# Patient Record
Sex: Male | Born: 2000
Health system: Southern US, Community
[De-identification: ages and names within clinical notes are randomized; demographics above are authoritative.]

## PROBLEM LIST (undated history)

## (undated) DIAGNOSIS — J45909 Unspecified asthma, uncomplicated: Principal | ICD-10-CM

## (undated) DIAGNOSIS — T7840XA Allergy, unspecified, initial encounter: Secondary | ICD-10-CM

## (undated) HISTORY — DX: Unspecified asthma, uncomplicated: J45.909

## (undated) HISTORY — PX: TEAR DUCT PROBING: SHX793

## (undated) HISTORY — DX: Allergy, unspecified, initial encounter: T78.40XA

---
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
## 2022-01-31 DIAGNOSIS — J301 Allergic rhinitis due to pollen: Secondary | ICD-10-CM | POA: Diagnosis not present

## 2022-01-31 DIAGNOSIS — R059 Cough, unspecified: Secondary | ICD-10-CM | POA: Diagnosis not present

## 2022-01-31 DIAGNOSIS — J029 Acute pharyngitis, unspecified: Secondary | ICD-10-CM | POA: Diagnosis not present

## 2022-06-06 ENCOUNTER — Encounter: Payer: Self-pay | Admitting: Pediatrics

## 2022-08-18 ENCOUNTER — Other Ambulatory Visit: Payer: Self-pay | Admitting: Pediatrics

## 2022-08-18 MED ORDER — MONTELUKAST SODIUM 10 MG PO TABS: 10.0000 mg | ORAL_TABLET | Freq: Every day | ORAL | 0 refills | Status: DC

## 2022-08-18 MED ORDER — AZITHROMYCIN 250 MG PO TABS: ORAL_TABLET | ORAL | 0 refills | Status: AC

## 2022-08-18 MED ORDER — ALBUTEROL SULFATE HFA 108 (90 BASE) MCG/ACT IN AERS: 2.0000 | INHALATION_SPRAY | Freq: Four times a day (QID) | RESPIRATORY_TRACT | 11 refills | Status: AC | PRN

## 2022-09-14 ENCOUNTER — Ambulatory Visit (INDEPENDENT_AMBULATORY_CARE_PROVIDER_SITE_OTHER): Payer: BC Managed Care – PPO | Admitting: Pediatrics

## 2022-09-14 ENCOUNTER — Encounter: Payer: Self-pay | Admitting: Pediatrics

## 2022-09-14 VITALS — Wt 160.6 lb

## 2022-09-14 DIAGNOSIS — J019 Acute sinusitis, unspecified: Secondary | ICD-10-CM

## 2022-09-14 DIAGNOSIS — B9689 Other specified bacterial agents as the cause of diseases classified elsewhere: Secondary | ICD-10-CM | POA: Diagnosis not present

## 2022-09-14 MED ORDER — AMOXICILLIN-POT CLAVULANATE 500-125 MG PO TABS: 1.0000 | ORAL_TABLET | Freq: Two times a day (BID) | ORAL | 0 refills | Status: AC

## 2022-09-14 NOTE — Progress Notes (Signed)
Subjective:     Steven Fuller is a 21 y.o. male who presents for post-nasal drainage. He has had a productive cough on and off for the past year. Starting over the summer, he will gag on mucus. He reports that the cough has resolved but he continues to gag on mucus and complains of sinus pain.   The following portions of the patient's history were reviewed and updated as appropriate: allergies, current medications, past family history, past medical history, past social history, past surgical history, and problem list.  Review of Systems Pertinent items are noted in HPI.   Objective:    Wt 160 lb 9.6 oz (72.8 kg)   BMI 22.40 kg/m  General appearance: alert, cooperative, appears stated age, and no distress Head: Normocephalic, without obvious abnormality, atraumatic Eyes: conjunctivae/corneas clear. PERRL, EOM's intact. Fundi benign. Ears: normal TM's and external ear canals both ears Nose: moderate congestion, turbinates swollen, sinus tenderness bilateral Throat: lips, mucosa, and tongue normal; teeth and gums normal Neck: no adenopathy, no carotid bruit, no JVD, supple, symmetrical, trachea midline, and thyroid not enlarged, symmetric, no tenderness/mass/nodules Lungs: clear to auscultation bilaterally Heart: regular rate and rhythm, S1, S2 normal, no murmur, click, rub or gallop    Assessment:    Acute bacterial sinusitis.    Plan:    Nasal saline sprays. Neti pot recommended. Instructions given. Augmentin per medication orders. Follow up in a few days or as needed.

## 2022-09-14 NOTE — Patient Instructions (Addendum)
Zyrtec daily in the morning Continue Singulair at bedtime Augmentin 2 times a day for 10 days Take daily probiotic while on antibiotics Flonase- 2 spray in each nostril once a day in the morning  Drink plenty of water Follow up as needed  At [facility] we value your feedback. You may receive a survey about your visit today. Please share your experience as we strive to create trusting relationships with our patients to provide genuine, compassionate, quality care.

## 2022-09-15 ENCOUNTER — Encounter: Payer: Self-pay | Admitting: Pediatrics

## 2022-10-25 ENCOUNTER — Ambulatory Visit (INDEPENDENT_AMBULATORY_CARE_PROVIDER_SITE_OTHER): Payer: BC Managed Care – PPO | Admitting: Pediatrics

## 2022-10-25 ENCOUNTER — Ambulatory Visit
Admission: RE | Admit: 2022-10-25 | Discharge: 2022-10-25 | Disposition: A | Discharge status: Home / Self Care | Payer: BC Managed Care – PPO | Source: Ambulatory Visit | Attending: Pediatrics | Admitting: Pediatrics

## 2022-10-25 ENCOUNTER — Encounter: Payer: Self-pay | Admitting: Pediatrics

## 2022-10-25 VITALS — BP 112/84 | Ht 71.0 in | Wt 160.2 lb

## 2022-10-25 DIAGNOSIS — Z0001 Encounter for general adult medical examination with abnormal findings: Secondary | ICD-10-CM

## 2022-10-25 DIAGNOSIS — Z00129 Encounter for routine child health examination without abnormal findings: Secondary | ICD-10-CM

## 2022-10-25 DIAGNOSIS — R0981 Nasal congestion: Secondary | ICD-10-CM | POA: Diagnosis not present

## 2022-10-25 DIAGNOSIS — R053 Chronic cough: Secondary | ICD-10-CM

## 2022-10-25 DIAGNOSIS — Z68.41 Body mass index (BMI) pediatric, 5th percentile to less than 85th percentile for age: Secondary | ICD-10-CM

## 2022-10-25 DIAGNOSIS — R059 Cough, unspecified: Secondary | ICD-10-CM | POA: Diagnosis not present

## 2022-10-25 DIAGNOSIS — Z1339 Encounter for screening examination for other mental health and behavioral disorders: Secondary | ICD-10-CM | POA: Diagnosis not present

## 2022-10-25 MED ORDER — MONTELUKAST SODIUM 10 MG PO TABS: 10.0000 mg | ORAL_TABLET | Freq: Every day | ORAL | 12 refills | Status: AC

## 2022-10-25 MED ORDER — FLUTICASONE PROPIONATE 50 MCG/ACT NA SUSP: 1.0000 | Freq: Every day | NASAL | 12 refills | Status: AC

## 2022-10-25 NOTE — Patient Instructions (Signed)
Preventive Care 18-21 Years Old, Male ?Preventive care refers to lifestyle choices and visits with your health care provider that can promote health and wellness. At this stage in your life, you may start seeing a primary care physician instead of a pediatrician for your preventive care. Preventive care visits are also called wellness exams. ?What can I expect for my preventive care visit? ?Counseling ?During your preventive care visit, your health care provider may ask about your: ?Medical history, including: ?Past medical problems. ?Family medical history. ?Current health, including: ?Home life and relationship well-being. ?Emotional well-being. ?Sexual activity and sexual health. ?Lifestyle, including: ?Alcohol, nicotine or tobacco, and drug use. ?Access to firearms. ?Diet, exercise, and sleep habits. ?Sunscreen use. ?Motor vehicle safety. ?Physical exam ?Your health care provider may check your: ?Height and weight. These may be used to calculate your BMI (body mass index). BMI is a measurement that tells if you are at a healthy weight. ?Waist circumference. This measures the distance around your waistline. This measurement also tells if you are at a healthy weight and may help predict your risk of certain diseases, such as type 2 diabetes and high blood pressure. ?Heart rate and blood pressure. ?Body temperature. ?Skin for abnormal spots. ?What immunizations do I need? ? ?Vaccines are usually given at various ages, according to a schedule. Your health care provider will recommend vaccines for you based on your age, medical history, and lifestyle or other factors, such as travel or where you work. ?What tests do I need? ?Screening ?Your health care provider may recommend screening tests for certain conditions. This may include: ?Vision and hearing tests. ?Lipid and cholesterol levels. ?Hepatitis B test. ?Hepatitis C test. ?HIV (human immunodeficiency virus) test. ?STI (sexually transmitted infection) testing, if  you are at risk. ?Tuberculosis skin test. ?Talk with your health care provider about your test results, treatment options, and if necessary, the need for more tests. ?Follow these instructions at home: ?Eating and drinking ? ?Eat a healthy diet that includes fresh fruits and vegetables, whole grains, lean protein, and low-fat dairy products. ?Drink enough fluid to keep your urine pale yellow. ?Do not drink alcohol if: ?Your health care provider tells you not to drink. ?You are under the legal drinking age. In the [place] the legal drinking age is 21. ?If you drink alcohol: ?Limit how much you have to 0-2 drinks a day. ?Know how much alcohol is in your drink. In the [place] one drink equals one 12 oz bottle of beer (355 mL), one 5 oz glass of wine (148 mL), or one 1? oz glass of hard liquor (44 mL). ?Lifestyle ?Brush your teeth every morning and night with fluoride toothpaste. Floss one time each day. ?Exercise for at least 30 minutes 5 or more days of the week. ?Do not use any products that contain nicotine or tobacco. These products include cigarettes, chewing tobacco, and vaping devices, such as e-cigarettes. If you need help quitting, ask your health care provider. ?Do not use drugs. ?If you are sexually active, practice safe sex. Use a condom or other form of protection to prevent STIs. ?Find healthy ways to manage stress, such as: ?Meditation, yoga, or listening to music. ?Journaling. ?Talking to a trusted person. ?Spending time with friends and family. ?Safety ?Always wear your seat belt while driving or riding in a vehicle. ?Do not drive: ?If you have been drinking alcohol. Do not ride with someone who has been drinking. ?When you are tired or distracted. ?While texting. ?If you have been using   any mind-altering substances or drugs. ?Wear a helmet and other protective equipment during sports activities. ?If you have firearms in your house, make sure you follow all gun safety procedures. ?Seek help if you have  been bullied, physically abused, or sexually abused. ?Use the internet responsibly to avoid dangers, such as online bullying and online sex predators. ?What's next? ?Go to your health care provider once a year for an annual wellness visit. ?Ask your health care provider how often you should have your eyes and teeth checked. ?Stay up to date on all vaccines. ?This information is not intended to replace advice given to you by your health care provider. Make sure you discuss any questions you have with your health care provider. ?Document Revised: 04/07/2021 Document Reviewed: 04/07/2021 ?Elsevier Patient Education ? 2023 [employer] ? ?

## 2022-10-25 NOTE — Progress Notes (Unsigned)
Swab matched for 4 year ALL---donor   09/12/24 --flu and COVID

## 2022-10-26 ENCOUNTER — Encounter: Payer: Self-pay | Admitting: Pediatrics

## 2022-10-26 DIAGNOSIS — R053 Chronic cough: Secondary | ICD-10-CM | POA: Insufficient documentation

## 2023-05-27 ENCOUNTER — Emergency Department (HOSPITAL_BASED_OUTPATIENT_CLINIC_OR_DEPARTMENT_OTHER)
Admission: EM | Admit: 2023-05-27 | Discharge: 2023-05-27 | Disposition: A | Discharge status: Home / Self Care | Payer: BC Managed Care – PPO | Attending: Emergency Medicine | Admitting: Emergency Medicine

## 2023-05-27 ENCOUNTER — Other Ambulatory Visit: Payer: Self-pay

## 2023-05-27 ENCOUNTER — Encounter (HOSPITAL_BASED_OUTPATIENT_CLINIC_OR_DEPARTMENT_OTHER): Payer: Self-pay

## 2023-05-27 DIAGNOSIS — Z20822 Contact with and (suspected) exposure to covid-19: Secondary | ICD-10-CM | POA: Diagnosis not present

## 2023-05-27 DIAGNOSIS — B349 Viral infection, unspecified: Secondary | ICD-10-CM

## 2023-05-27 DIAGNOSIS — R197 Diarrhea, unspecified: Secondary | ICD-10-CM | POA: Insufficient documentation

## 2023-05-27 DIAGNOSIS — R519 Headache, unspecified: Secondary | ICD-10-CM | POA: Diagnosis not present

## 2023-05-27 DIAGNOSIS — R1110 Vomiting, unspecified: Secondary | ICD-10-CM | POA: Insufficient documentation

## 2023-05-27 DIAGNOSIS — R7309 Other abnormal glucose: Secondary | ICD-10-CM | POA: Insufficient documentation

## 2023-05-27 LAB — CBC WITH DIFFERENTIAL/PLATELET
Abs Immature Granulocytes: 0.02 10*3/uL (ref 0.00–0.07)
Basophils Absolute: 0 10*3/uL (ref 0.0–0.1)
Basophils Relative: 0 %
Eosinophils Absolute: 0 10*3/uL (ref 0.0–0.5)
Eosinophils Relative: 0 %
HCT: 44.2 % (ref 39.0–52.0)
Hemoglobin: 16.2 g/dL (ref 13.0–17.0)
Immature Granulocytes: 0 %
Lymphocytes Relative: 6 %
Lymphs Abs: 0.6 10*3/uL — ABNORMAL LOW (ref 0.7–4.0)
MCH: 31.6 pg (ref 26.0–34.0)
MCHC: 36.7 g/dL — ABNORMAL HIGH (ref 30.0–36.0)
MCV: 86.3 fL (ref 80.0–100.0)
Monocytes Absolute: 0.7 10*3/uL (ref 0.1–1.0)
Monocytes Relative: 6 %
Neutro Abs: 9.2 10*3/uL — ABNORMAL HIGH (ref 1.7–7.7)
Neutrophils Relative %: 88 %
Platelets: 178 10*3/uL (ref 150–400)
RBC: 5.12 MIL/uL (ref 4.22–5.81)
RDW: 12.2 % (ref 11.5–15.5)
WBC: 10.5 10*3/uL (ref 4.0–10.5)
nRBC: 0 % (ref 0.0–0.2)

## 2023-05-27 LAB — COMPREHENSIVE METABOLIC PANEL
ALT: 18 U/L (ref 0–44)
AST: 18 U/L (ref 15–41)
Albumin: 4.3 g/dL (ref 3.5–5.0)
Alkaline Phosphatase: 68 U/L (ref 38–126)
Anion gap: 9 (ref 5–15)
BUN: 11 mg/dL (ref 6–20)
CO2: 25 mmol/L (ref 22–32)
Calcium: 9.3 mg/dL (ref 8.9–10.3)
Chloride: 102 mmol/L (ref 98–111)
Creatinine, Ser: 1.03 mg/dL (ref 0.61–1.24)
GFR, Estimated: >60 mL/min (ref >60)
Glucose, Bld: 131 mg/dL — ABNORMAL HIGH (ref 70–99)
Potassium: 3.3 mmol/L — ABNORMAL LOW (ref 3.5–5.1)
Sodium: 136 mmol/L (ref 135–145)
Total Bilirubin: 1 mg/dL (ref 0.3–1.2)
Total Protein: 7.3 g/dL (ref 6.5–8.1)

## 2023-05-27 LAB — SARS CORONAVIRUS 2 BY RT PCR: SARS Coronavirus 2 by RT PCR: NEGATIVE

## 2023-05-27 LAB — LIPASE, BLOOD: Lipase: 29 U/L (ref 11–51)

## 2023-05-27 MED ORDER — SODIUM CHLORIDE 0.9 % IV BOLUS
1000.0000 mL | Freq: Once | INTRAVENOUS | Status: AC
  Administered 2023-05-27: 1000 mL via INTRAVENOUS

## 2023-05-27 MED ORDER — DEXAMETHASONE SODIUM PHOSPHATE 10 MG/ML IJ SOLN
8.0000 mg | Freq: Once | INTRAMUSCULAR | Status: AC
  Administered 2023-05-27: 8 mg via INTRAVENOUS
  Filled 2023-05-27: qty 1

## 2023-05-27 MED ORDER — KETOROLAC TROMETHAMINE 30 MG/ML IJ SOLN
30.0000 mg | Freq: Once | INTRAMUSCULAR | Status: AC
  Administered 2023-05-27: 30 mg via INTRAVENOUS
  Filled 2023-05-27: qty 1

## 2023-05-27 MED ORDER — ONDANSETRON 4 MG PO TBDP: 4.0000 mg | ORAL_TABLET | Freq: Three times a day (TID) | ORAL | 0 refills | Status: AC | PRN

## 2023-05-27 NOTE — ED Notes (Signed)
Pt d/c home with father per MD order . Discharge summary reviewed, pt verbalizes understanding. Ambulatory off unit. No s/s of acute distress noted at discharge.

## 2023-05-27 NOTE — ED Provider Notes (Incomplete)
[place] EMERGENCY DEPARTMENT AT [facility] Provider Note   CSN: [id number] Arrival date & time: 05/27/23  1701     History {Add pertinent medical, surgical, social history, OB history to HPI:1} Chief Complaint  Patient presents with   Emesis   Headache    Steven Fuller is a 22 y.o. male who presents emergency department for flulike symptoms.  Patient just returned from a trip from [place] a few days ago.  He did go to [place] for a day.  2 days he developed a headache.  He has had a constant headache since that time which is throbbing.  He has also had 2 episodes of vomiting and a few episodes of diarrhea.  He has been resting over the past 2 days.  He denies any photophobia, phonophobia, fever, rash.  He took Tylenol, antidiarrheals.  He had some relief of his take and complete resolution of his diarrhea.  .  He has not had any nausea.he has been vaccinated for meningitis.  He is unsure of any contacts with similar symptoms.  He called a nurse triage line today and due to his neck being sore he was sent in for further evaluation.   Emesis Associated symptoms: headaches   Headache Associated symptoms: vomiting        Home Medications Prior to Admission medications   Medication Sig Start Date End Date Taking? Authorizing Provider  albuterol (VENTOLIN HFA) 108 (90 Base) MCG/ACT inhaler Inhale 2 puffs into the lungs every 6 (six) hours as needed for wheezing or shortness of breath. 08/18/22   Georgiann Hahn, MD  fluticasone (FLONASE) 50 MCG/ACT nasal spray Place 1 spray into both nostrils daily. 10/25/22 11/24/22  Georgiann Hahn, MD  montelukast (SINGULAIR) 10 MG tablet Take 1 tablet (10 mg total) by mouth at bedtime. 10/25/22 11/24/22  Georgiann Hahn, MD      Allergies    Patient has no known allergies.    Review of Systems   Review of Systems  Gastrointestinal:  Positive for vomiting.  Neurological:  Positive for headaches.    Physical Exam Updated Vital  Signs BP (!) 140/72 (BP Location: Right Arm)   Pulse (!) 104   Temp 99.5 F (37.5 C) (Oral)   Resp 19   Ht 5\' 11"  (1.803 m)   Wt 74.8 kg   SpO2 99%   BMI 23.01 kg/m  Physical Exam Vitals and nursing note reviewed.  Constitutional:      General: He is not in acute distress.    Appearance: He is well-developed. He is not diaphoretic.  HENT:     Head: Normocephalic and atraumatic.     Nose: Nose normal.     Mouth/Throat:     Mouth: Mucous membranes are moist.  Eyes:     General: No scleral icterus.    Conjunctiva/sclera: Conjunctivae normal.     Pupils: Pupils are equal, round, and reactive to light.     Comments: No horizontal, vertical or rotational nystagmus  Neck:     Comments: Full active and passive ROM  Soreness with flexion of the neck No midline or paraspinal tenderness No nuchal rigidity  Negative Brudzinski's or Kernig's Cardiovascular:     Rate and Rhythm: Normal rate and regular rhythm.     Heart sounds: Normal heart sounds.  Pulmonary:     Effort: Pulmonary effort is normal. No respiratory distress.     Breath sounds: Normal breath sounds. No wheezing or rales.  Abdominal:     General: There is  no distension.     Palpations: Abdomen is soft.     Tenderness: There is no abdominal tenderness. There is no guarding or rebound.  Musculoskeletal:        General: Normal range of motion.     Cervical back: Normal range of motion and neck supple.  Lymphadenopathy:     Cervical: No cervical adenopathy.  Skin:    General: Skin is warm and dry.     Findings: No rash.  Neurological:     Mental Status: He is alert and oriented to person, place, and time.     Cranial Nerves: No cranial nerve deficit.     Motor: No abnormal muscle tone.     Coordination: Coordination normal.     Comments: Mental Status:  Alert, oriented, thought content appropriate. Speech fluent without evidence of aphasia. Able to follow 2 step commands without difficulty.  Cranial Nerves:  II:   Peripheral visual fields grossly normal, pupils equal, round, reactive to light III,IV, VI: ptosis not present, extra-ocular motions intact bilaterally  V,VII: smile symmetric, facial light touch sensation equal VIII: hearing grossly normal bilaterally  IX,X: midline uvula rise  XI: bilateral shoulder shrug equal and strong XII: midline tongue extension  Motor:  5/5 in upper and lower extremities bilaterally including strong and equal grip strength and dorsiflexion/plantar flexion Sensory: Pinprick and light touch normal in all extremities.  Cerebellar: normal finger-to-nose with bilateral upper extremities Gait: normal gait and balance CV: distal pulses palpable throughout   Psychiatric:        Behavior: Behavior normal.        Thought Content: Thought content normal.        Judgment: Judgment normal.     ED Results / Procedures / Treatments   Labs (all labs ordered are listed, but only abnormal results are displayed) Labs Reviewed  SARS CORONAVIRUS 2 BY RT PCR  COMPREHENSIVE METABOLIC PANEL  LIPASE, BLOOD  CBC WITH DIFFERENTIAL/PLATELET    EKG None  Radiology No results found.  Procedures Procedures  {Document cardiac monitor, telemetry assessment procedure when appropriate:1}  Medications Ordered in ED Medications  sodium chloride 0.9 % bolus 1,000 mL (has no administration in time range)  ketorolac (TORADOL) 30 MG/ML injection 30 mg (has no administration in time range)  dexamethasone (DECADRON) injection 8 mg (has no administration in time range)    ED Course/ Medical Decision Making/ A&P   {   Click here for ABCD2, HEART and other calculatorsREFRESH Note before signing :1}                              Medical Decision Making Amount and/or Complexity of Data Reviewed Labs: ordered.  Risk Prescription drug management.   ***  {Document critical care time when appropriate:1} {Document review of labs and clinical decision tools ie heart score,  Chads2Vasc2 etc:1}  {Document your independent review of radiology images, and any outside records:1} {Document your discussion with family members, caretakers, and with consultants:1} {Document social determinants of health affecting pt's care:1} {Document your decision making why or why not admission, treatments were needed:1} Final Clinical Impression(s) / ED Diagnoses Final diagnoses:  None    Rx / DC Orders ED Discharge Orders     None

## 2023-05-27 NOTE — Discharge Instructions (Signed)
Contact a health care provider if:  Medicine does not help your symptoms.  You have a headache that is different from your usual headache.  You have nausea or you vomit.  You have a fever.  Get help right away if:  Your headache:  Becomes severe quickly.  Gets worse after moderate to intense physical activity.  You have any of these symptoms:  Repeated vomiting.  Pain or stiffness in your neck.  Changes to your vision.  Pain in an eye or ear.  Problems with speech.  Muscular weakness or loss of muscle control.  Loss of balance or coordination.  You feel faint or pass out.  You have confusion.  You have a seizure.  These symptoms may represent a serious problem that is an emergency. Do not wait to see if the symptoms will go away. Get medical help right away. Call your local emergency services (911 in the [place] Do not drive yourself to the hospital.

## 2023-05-27 NOTE — ED Triage Notes (Signed)
Pt to ED c/o HA, Emesis, diarrhea x 36 hours. Pt reports recent international travel to [place] and a friend who was sick.

## 2023-05-30 ENCOUNTER — Telehealth: Payer: Self-pay | Admitting: Pediatrics

## 2023-05-30 NOTE — Transitions of Care (Post Inpatient/ED Visit) (Signed)
   05/30/2023  Name: Steven Fuller MRN: [id number] DOB: [birth year]  Today's TOC FU Call Status: Today's TOC FU Call Status:: Successful TOC FU Call Completed TOC FU Call Complete Date: 05/30/23  Transition Care Management Follow-up Telephone Call Date of Discharge: 05/27/23 Discharge Facility: [facility] Type of Discharge: Emergency Department How have you been since you were released from the hospital?: Better Any questions or concerns?: No  Items Reviewed: Did you receive and understand the discharge instructions provided?: Yes Medications obtained,verified, and reconciled?: Yes (Medications Reviewed) Any new allergies since your discharge?: No Dietary orders reviewed?: NA Do you have support at home?: Yes  Medications Reviewed Today: Medications Reviewed Today   Medications were not reviewed in this encounter     Home Care and Equipment/Supplies: Were Home Health Services Ordered?: NA Any new equipment or medical supplies ordered?: NA  Functional Questionnaire: Do you need assistance with bathing/showering or dressing?: No Do you need assistance with meal preparation?: No Do you need assistance with eating?: No Do you have difficulty maintaining continence: No Do you need assistance with getting out of bed/getting out of a chair/moving?: No Do you have difficulty managing or taking your medications?: No  Follow up appointments reviewed: PCP Follow-up appointment confirmed?: NA Specialist Hospital Follow-up appointment confirmed?: NA Do you need transportation to your follow-up appointment?: No Do you understand care options if your condition(s) worsen?: Yes-patient verbalized understanding    SIGNATURE

## 2023-07-04 ENCOUNTER — Encounter: Payer: Self-pay | Admitting: Pediatrics

## 2023-10-19 DIAGNOSIS — Z0000 Encounter for general adult medical examination without abnormal findings: Secondary | ICD-10-CM | POA: Diagnosis not present

## 2023-10-20 DIAGNOSIS — Z0189 Encounter for other specified special examinations: Secondary | ICD-10-CM | POA: Diagnosis not present

## 2023-10-20 DIAGNOSIS — Z0000 Encounter for general adult medical examination without abnormal findings: Secondary | ICD-10-CM | POA: Diagnosis not present

## 2023-10-26 DIAGNOSIS — R82998 Other abnormal findings in urine: Secondary | ICD-10-CM | POA: Diagnosis not present

## 2023-10-26 DIAGNOSIS — Z1331 Encounter for screening for depression: Secondary | ICD-10-CM | POA: Diagnosis not present

## 2023-10-26 DIAGNOSIS — Z0000 Encounter for general adult medical examination without abnormal findings: Secondary | ICD-10-CM | POA: Diagnosis not present

## 2023-12-28 DIAGNOSIS — H66002 Acute suppurative otitis media without spontaneous rupture of ear drum, left ear: Secondary | ICD-10-CM | POA: Diagnosis not present

## 2023-12-28 DIAGNOSIS — S00412A Abrasion of left ear, initial encounter: Secondary | ICD-10-CM | POA: Diagnosis not present

## 2023-12-28 DIAGNOSIS — H6123 Impacted cerumen, bilateral: Secondary | ICD-10-CM | POA: Diagnosis not present

## 2023-12-28 DIAGNOSIS — H60392 Other infective otitis externa, left ear: Secondary | ICD-10-CM | POA: Diagnosis not present
